# Patient Record
Sex: Male | Born: 1995 | Race: White | Hispanic: No | Marital: Single | Smoking: Never smoker
Health system: Southern US, Community
[De-identification: ages and names within clinical notes are randomized; demographics above are authoritative.]

## PROBLEM LIST (undated history)

## (undated) ENCOUNTER — Emergency Department (HOSPITAL_COMMUNITY): Discharge status: Absent without leave | Payer: Self-pay

## (undated) DIAGNOSIS — M928 Other specified juvenile osteochondrosis: Secondary | ICD-10-CM

## (undated) DIAGNOSIS — K219 Gastro-esophageal reflux disease without esophagitis: Secondary | ICD-10-CM

## (undated) HISTORY — DX: Gastro-esophageal reflux disease without esophagitis: K21.9

## (undated) HISTORY — DX: Other specified juvenile osteochondrosis: M92.8

---
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
## 2017-01-24 IMAGING — CR DG KNEE COMPLETE 4+V*L*
4 series · 4 of 4 positions shown · non-contrast
Comparison: None.

CLINICAL DATA: Injured knee at work on [REDACTED]. Persistent Pain and
swelling.

EXAM:
LEFT KNEE - COMPLETE 4+ VIEW

[knee ap]
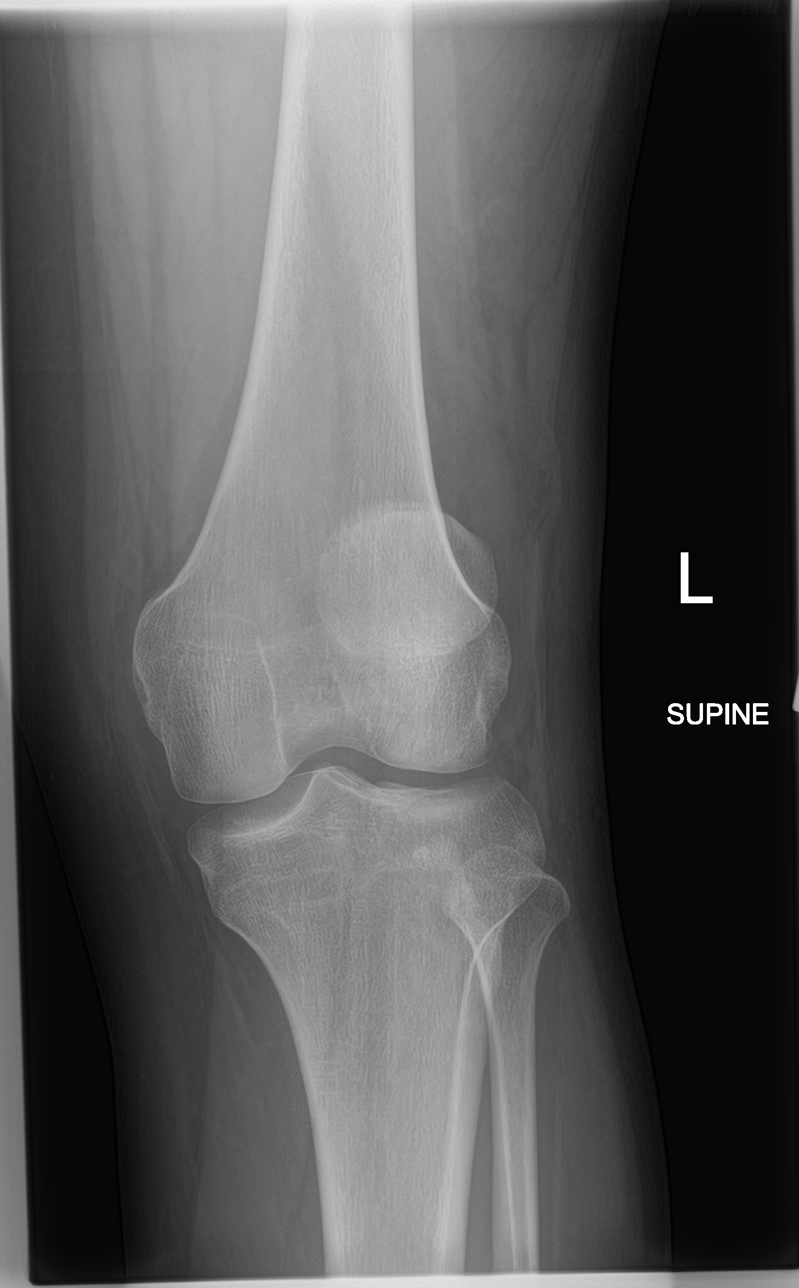

[knee lat]
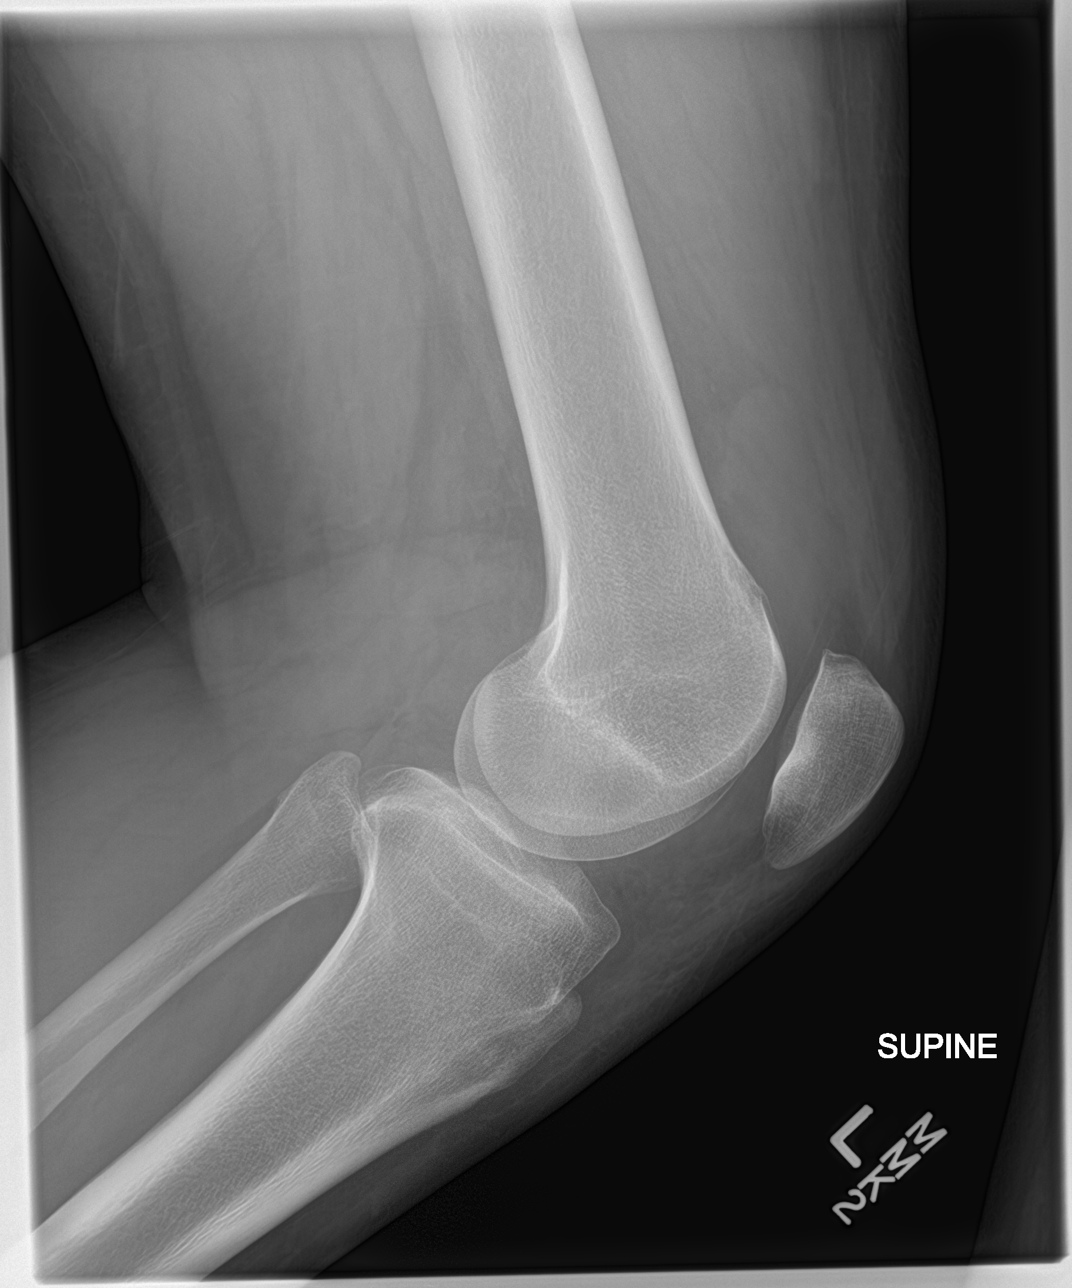

[knee obl (1 of 2)]
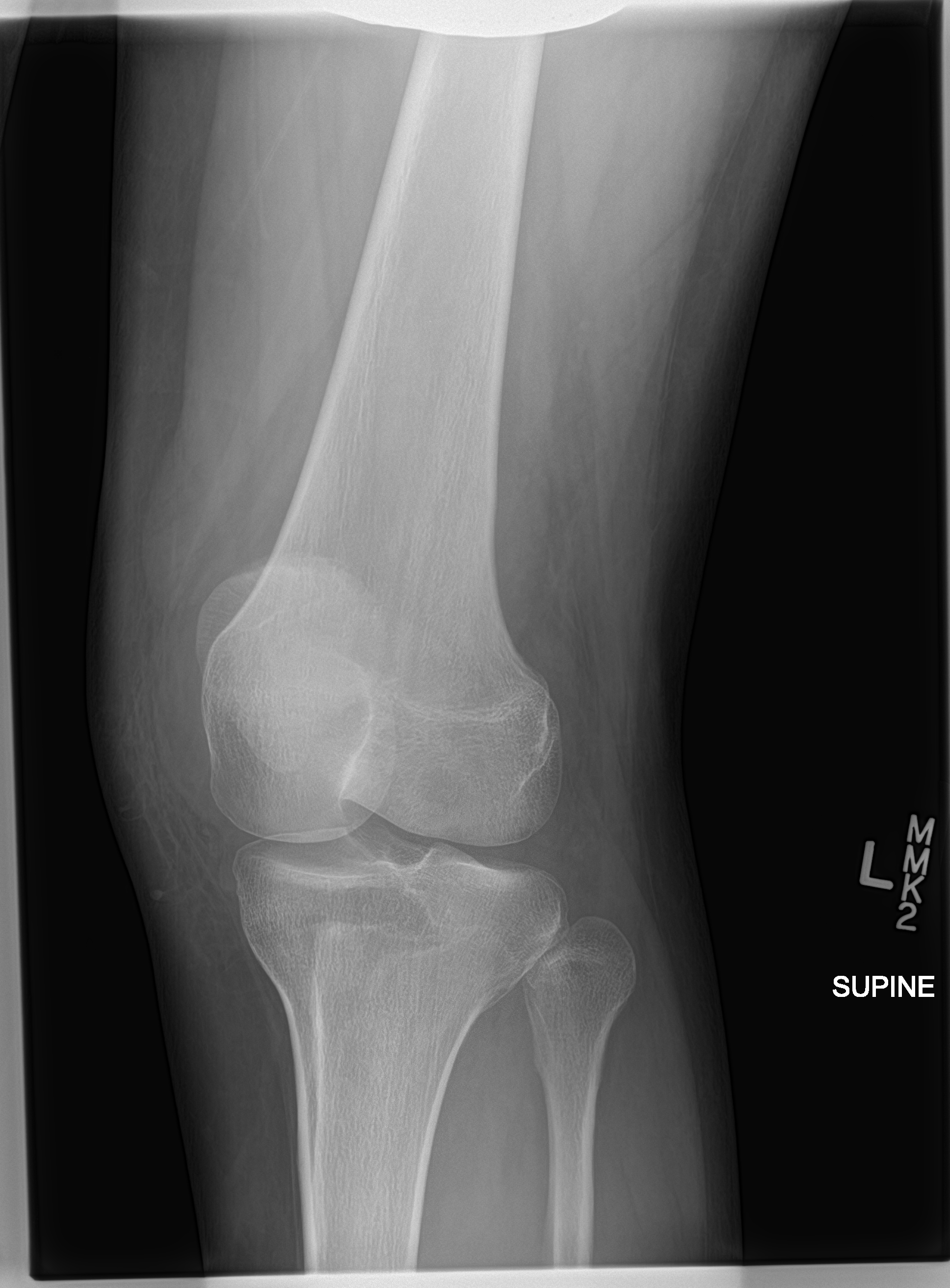

[knee obl (2 of 2)]
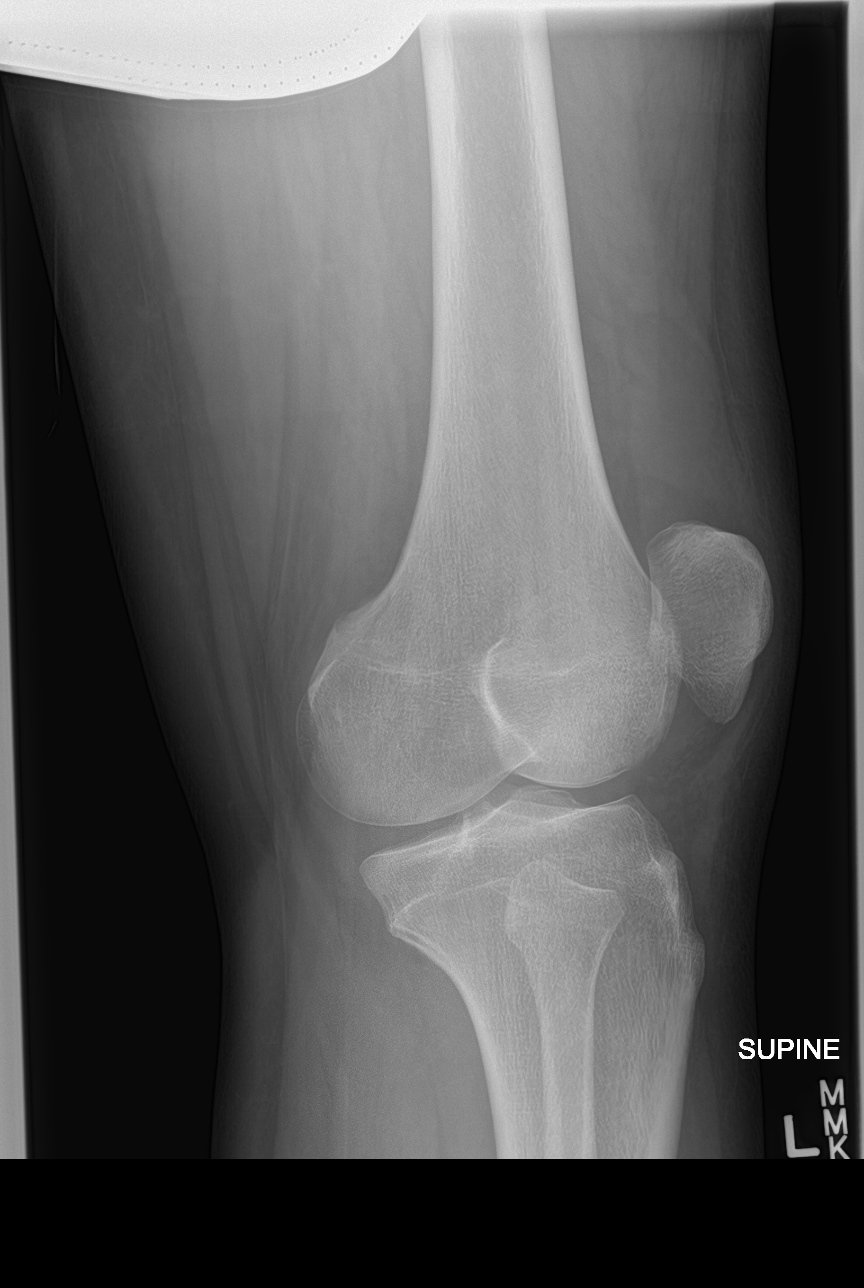

[4 of 4 positions shown; findings below may reference images not displayed]

FINDINGS: The joint spaces are maintained. No acute fracture is identified.
There is a moderate-sized joint effusion which may suggest internal
derangement.
IMPRESSION: No acute fracture.

Moderate-sized joint effusion may suggest internal derangement.
